# Patient Record
Sex: Male | Born: 2000 | Race: White | Hispanic: No | Marital: Single | Smoking: Never smoker
Health system: Southern US, Community
[De-identification: ages and names within clinical notes are randomized; demographics above are authoritative.]

---
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
## 2021-05-24 IMAGING — CR DG CHEST 2V
1 series · 2 of 2 positions shown · non-contrast
Comparison: None.

CLINICAL DATA: 19-year-old male with fever.

EXAM:
CHEST - 2 VIEW

[Series 1: dg chest 2 view · 0.14mm/px · 2 of 2 slices shown]
[im 1/2]
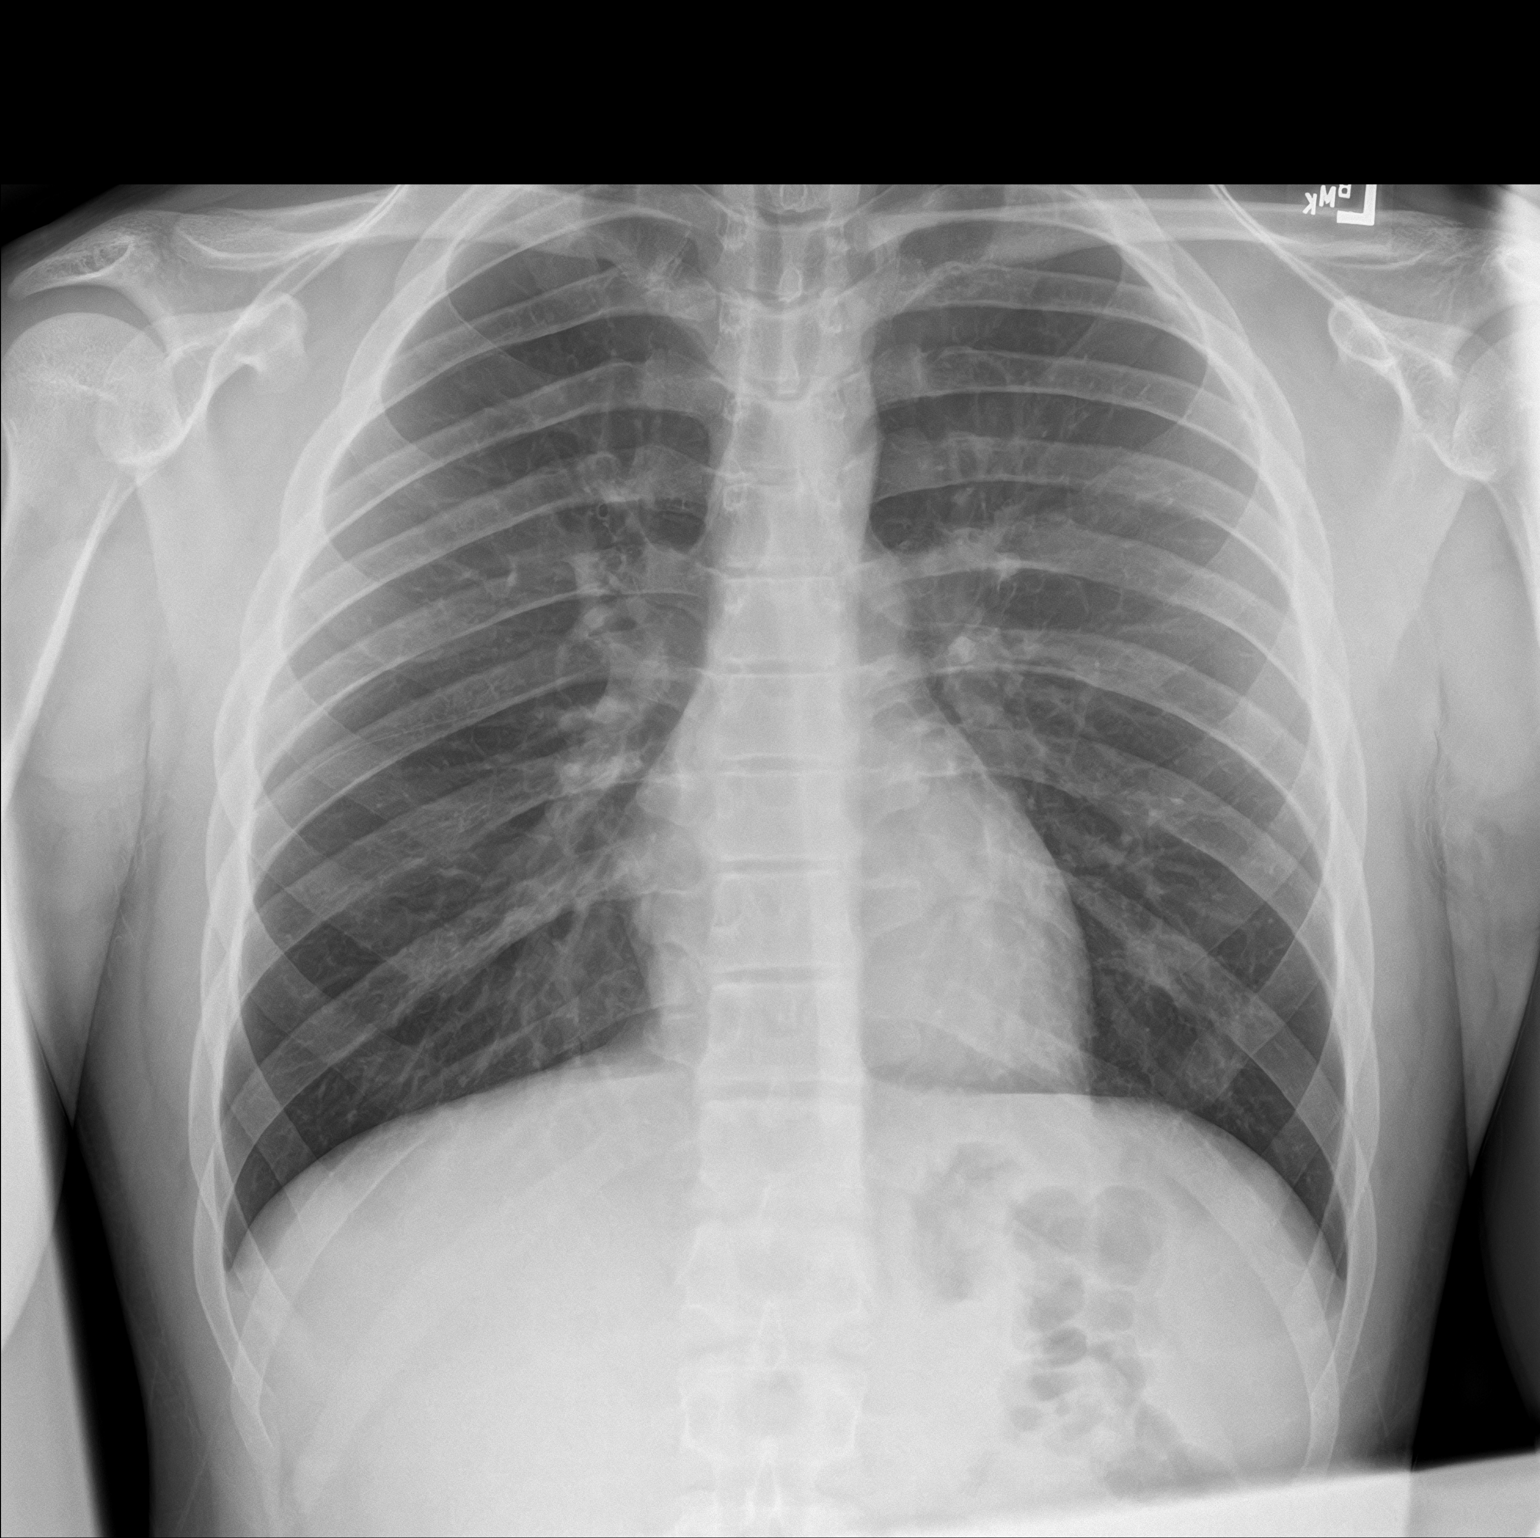
[im 2/2]
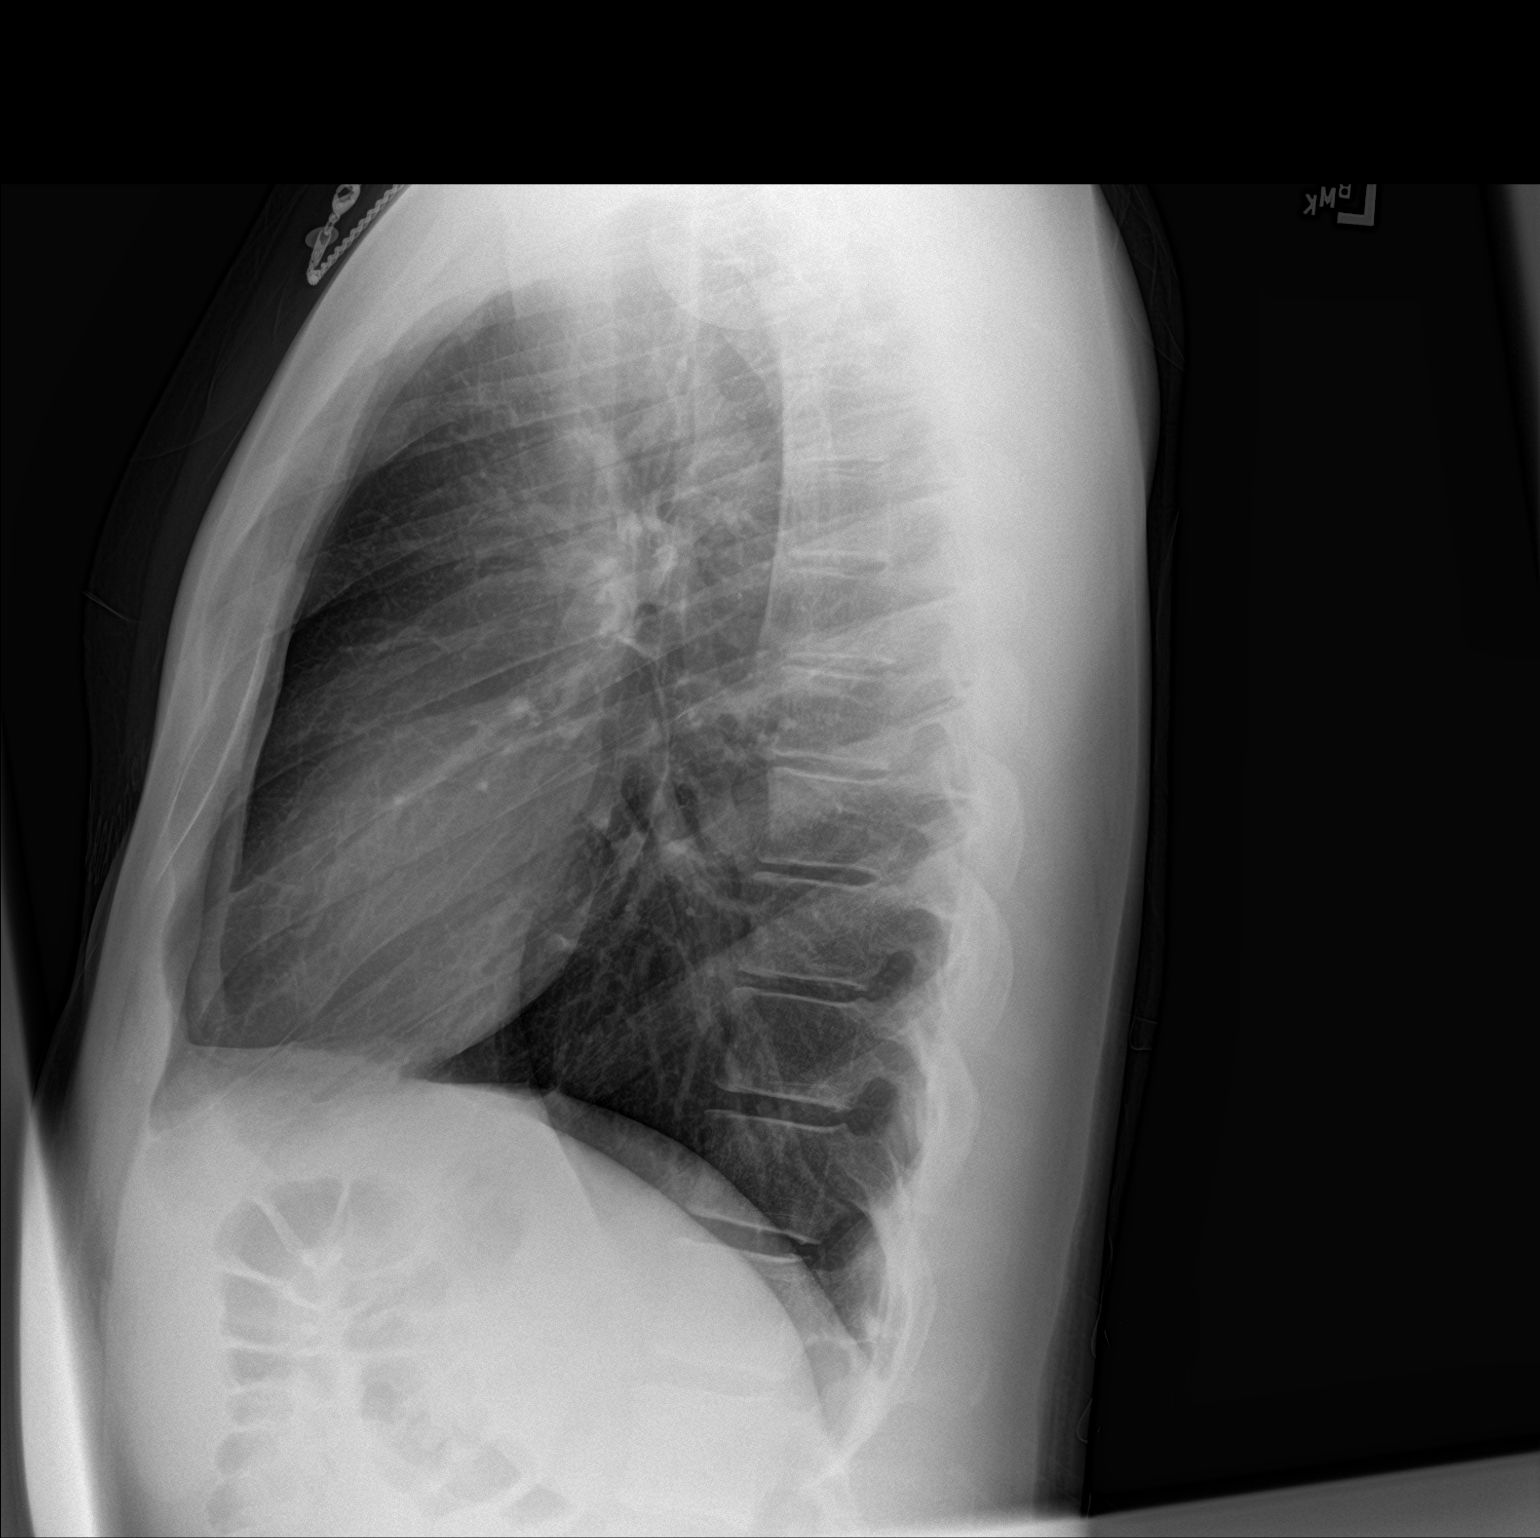

[2 of 2 positions shown; findings below may reference images not displayed]

FINDINGS: The heart size and mediastinal contours are within normal limits.
Both lungs are clear. The visualized skeletal structures are
unremarkable.
IMPRESSION: No active cardiopulmonary disease.
